# Patient Record
Sex: Male | Born: 1995 | Race: White | Hispanic: Yes | Marital: Single | Smoking: Never smoker
Health system: Southern US, Community
[De-identification: ages and names within clinical notes are randomized; demographics above are authoritative.]

---
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
## 2021-12-24 ENCOUNTER — Ambulatory Visit
Admission: EM | Admit: 2021-12-24 | Discharge: 2021-12-24 | Disposition: A | Discharge status: Home / Self Care | Payer: Self-pay | Attending: Family Medicine | Admitting: Family Medicine

## 2021-12-24 ENCOUNTER — Encounter: Payer: Self-pay | Admitting: Emergency Medicine

## 2021-12-24 ENCOUNTER — Ambulatory Visit: Payer: Self-pay

## 2021-12-24 ENCOUNTER — Other Ambulatory Visit: Payer: Self-pay

## 2021-12-24 DIAGNOSIS — H1033 Unspecified acute conjunctivitis, bilateral: Secondary | ICD-10-CM

## 2021-12-24 MED ORDER — ERYTHROMYCIN 5 MG/GM OP OINT: TOPICAL_OINTMENT | OPHTHALMIC | 0 refills | Status: AC

## 2021-12-24 NOTE — ED Triage Notes (Addendum)
Patient c/o bilateral eye dryness x 2 days.   Patient endorses eye redness. Patient endorses keeping eyes shut makes symptoms better.   Patient denies visual changes.  Patient endorses wearing contacts some days.   Patient has used OTC eye drops with no relief of symptoms.   Patient denies previous problems with eye dryness in medical history.

## 2021-12-24 NOTE — Discharge Instructions (Signed)
Follow up with an Eye Specialist if not improving °

## 2021-12-24 NOTE — ED Provider Notes (Signed)
[facility]    CSN: [id number] Arrival date & time: 12/24/21  1542      History   Chief Complaint Chief Complaint  Patient presents with   Eye Problem    HPI Jordan Stephens is a 26 y.o. male.   Patient presenting today with 2-day history of eye redness, irritation.  Denies drainage, headache, visual changes, fever, congestion, cough.  No injury to the eye that he is aware of.  No past history of chronic eye issues.  Does wear contact sometimes but has not worn them anytime recently.  Trying over-the-counter eyedrops with no relief.  History reviewed. No pertinent past medical history.  There are no problems to display for this patient.   History reviewed. No pertinent surgical history.   Home Medications    Prior to Admission medications   Medication Sig Start Date End Date Taking? Authorizing Provider  erythromycin ophthalmic ointment Place a 1/2 inch ribbon of ointment into the lower eyelid b/l BID. 12/24/21  Yes Particia Nearing, PA-C   Family History History reviewed. No pertinent family history.  Social History Social History   Tobacco Use   Smoking status: Never   Smokeless tobacco: Never  Vaping Use   Vaping Use: Never used  Substance Use Topics   Alcohol use: Not Currently   Drug use: Never     Allergies   Patient has no known allergies.   Review of Systems Review of Systems Per HPI  Physical Exam Triage Vital Signs ED Triage Vitals  Enc Vitals Group     BP 12/24/21 1552 119/72     Pulse Rate 12/24/21 1552 70     Resp 12/24/21 1552 16     Temp 12/24/21 1552 98.2 F (36.8 C)     Temp Source 12/24/21 1552 Oral     SpO2 12/24/21 1552 98 %     Weight 12/24/21 1556 180 lb (81.6 kg)     Height 12/24/21 1556 5\' 10"  (1.778 m)     Head Circumference --      Peak Flow --      Pain Score 12/24/21 1556 0     Pain Loc --      Pain Edu? --      Excl. in GC? --    No data found.  Updated Vital Signs BP 119/72 (BP Location:  Right Arm)    Pulse 70    Temp 98.2 F (36.8 C) (Oral)    Resp 16    Ht 5\' 10"  (1.778 m)    Wt 180 lb (81.6 kg)    SpO2 98%    BMI 25.83 kg/m   Visual Acuity Right Eye Distance:   Left Eye Distance:   Bilateral Distance:    Right Eye Near:   Left Eye Near:    Bilateral Near:     Physical Exam Vitals and nursing note reviewed.  Constitutional:      Appearance: Normal appearance.  HENT:     Head: Atraumatic.     Nose: Nose normal.     Mouth/Throat:     Mouth: Mucous membranes are moist.  Eyes:     General:        Right eye: No discharge.        Left eye: No discharge.     Extraocular Movements: Extraocular movements intact.     Comments: Bilateral conjunctival erythema  Cardiovascular:     Rate and Rhythm: Normal rate and regular rhythm.  Pulmonary:  Effort: Pulmonary effort is normal.     Breath sounds: Normal breath sounds.  Musculoskeletal:        General: Normal range of motion.     Cervical back: Normal range of motion and neck supple.  Skin:    General: Skin is warm and dry.  Neurological:     General: No focal deficit present.     Mental Status: He is oriented to person, place, and time.  Psychiatric:        Mood and Affect: Mood normal.        Thought Content: Thought content normal.        Judgment: Judgment normal.     UC Treatments / Results  Labs (all labs ordered are listed, but only abnormal results are displayed) Labs Reviewed - No data to display  EKG   Radiology No results found.  Procedures Procedures (including critical care time)  Medications Ordered in UC Medications - No data to display  Initial Impression / Assessment and Plan / UC Course  I have reviewed the triage vital signs and the nursing notes.  Pertinent labs & imaging results that were available during my care of the patient were reviewed by me and considered in my medical decision making (see chart for details).     Vital signs reassuring, suspect conjunctivitis.   Will treat with erythromycin ointment, compresses, avoid contact use.  Eye specialist follow-up if worsening or not resolving.  Final Clinical Impressions(s) / UC Diagnoses   Final diagnoses:  Acute bacterial conjunctivitis of both eyes     Discharge Instructions      Follow up with an Eye Specialist if not improving    ED Prescriptions     Medication Sig Dispense Auth. Provider   erythromycin ophthalmic ointment Place a 1/2 inch ribbon of ointment into the lower eyelid b/l BID. 3.5 g Particia Nearing, PA-C      PDMP not reviewed this encounter.   Particia Nearing, [place] 12/24/21 1629
